# Patient Record
Sex: Female | Born: 1940 | Hispanic: Yes | Marital: Married | Smoking: Former smoker
Health system: Southern US, Community
[De-identification: ages and names within clinical notes are randomized; demographics above are authoritative.]

## PROBLEM LIST (undated history)

## (undated) DIAGNOSIS — Z86010 Personal history of colonic polyps: Secondary | ICD-10-CM

## (undated) DIAGNOSIS — I10 Essential (primary) hypertension: Secondary | ICD-10-CM

## (undated) DIAGNOSIS — M7580 Other shoulder lesions, unspecified shoulder: Secondary | ICD-10-CM

## (undated) HISTORY — DX: Other shoulder lesions, unspecified shoulder: M75.80

## (undated) HISTORY — PX: APPENDECTOMY: SHX54

## (undated) HISTORY — PX: CHOLECYSTECTOMY: SHX55

## (undated) HISTORY — DX: Essential (primary) hypertension: I10

## (undated) HISTORY — PX: CATARACT EXTRACTION W/ INTRAOCULAR LENS  IMPLANT, BILATERAL: SHX1307

---

## 2006-01-25 ENCOUNTER — Other Ambulatory Visit: Admission: RE | Admit: 2006-01-25 | Discharge: 2006-01-25 | Payer: Self-pay | Admitting: Gynecology

## 2006-05-15 ENCOUNTER — Encounter: Admission: RE | Admit: 2006-05-15 | Discharge: 2006-05-15 | Payer: Self-pay | Admitting: Gynecology

## 2007-01-29 ENCOUNTER — Other Ambulatory Visit: Admission: RE | Admit: 2007-01-29 | Discharge: 2007-01-29 | Payer: Self-pay | Admitting: Gynecology

## 2007-04-22 ENCOUNTER — Ambulatory Visit: Payer: Self-pay | Admitting: Gastroenterology

## 2007-04-22 ENCOUNTER — Ambulatory Visit (HOSPITAL_COMMUNITY): Admission: RE | Admit: 2007-04-22 | Discharge: 2007-04-22 | Payer: Self-pay | Admitting: Gastroenterology

## 2007-05-17 ENCOUNTER — Encounter: Admission: RE | Admit: 2007-05-17 | Discharge: 2007-05-17 | Payer: Self-pay | Admitting: Gynecology

## 2008-01-30 ENCOUNTER — Other Ambulatory Visit: Admission: RE | Admit: 2008-01-30 | Discharge: 2008-01-30 | Payer: Self-pay | Admitting: Gynecology

## 2008-02-13 ENCOUNTER — Ambulatory Visit (HOSPITAL_COMMUNITY): Admission: RE | Admit: 2008-02-13 | Discharge: 2008-02-13 | Payer: Self-pay | Admitting: Family Medicine

## 2008-05-22 ENCOUNTER — Encounter: Admission: RE | Admit: 2008-05-22 | Discharge: 2008-05-22 | Payer: Self-pay | Admitting: Gynecology

## 2008-07-31 ENCOUNTER — Ambulatory Visit: Payer: Self-pay | Admitting: Gynecology

## 2008-07-31 ENCOUNTER — Other Ambulatory Visit: Admission: RE | Admit: 2008-07-31 | Discharge: 2008-07-31 | Payer: Self-pay | Admitting: Gynecology

## 2008-07-31 ENCOUNTER — Encounter: Payer: Self-pay | Admitting: Gynecology

## 2009-04-29 ENCOUNTER — Encounter: Payer: Self-pay | Admitting: Gynecology

## 2009-04-29 ENCOUNTER — Ambulatory Visit: Payer: Self-pay | Admitting: Gynecology

## 2009-04-29 ENCOUNTER — Other Ambulatory Visit: Admission: RE | Admit: 2009-04-29 | Discharge: 2009-04-29 | Payer: Self-pay | Admitting: Gynecology

## 2009-05-24 ENCOUNTER — Encounter: Admission: RE | Admit: 2009-05-24 | Discharge: 2009-05-24 | Payer: Self-pay | Admitting: Gynecology

## 2010-02-15 ENCOUNTER — Ambulatory Visit (HOSPITAL_COMMUNITY): Admission: RE | Admit: 2010-02-15 | Discharge: 2010-02-15 | Payer: Self-pay | Admitting: Family Medicine

## 2010-05-02 ENCOUNTER — Other Ambulatory Visit: Admission: RE | Admit: 2010-05-02 | Discharge: 2010-05-02 | Payer: Self-pay | Admitting: Gynecology

## 2010-05-02 ENCOUNTER — Ambulatory Visit: Payer: Self-pay | Admitting: Gynecology

## 2010-05-30 ENCOUNTER — Encounter: Admission: RE | Admit: 2010-05-30 | Discharge: 2010-05-30 | Payer: Self-pay | Admitting: Gynecology

## 2010-06-21 ENCOUNTER — Ambulatory Visit: Payer: Self-pay | Admitting: Gynecology

## 2010-07-24 ENCOUNTER — Encounter: Payer: Self-pay | Admitting: Gynecology

## 2010-11-15 NOTE — Op Note (Signed)
NAMECITLALLI, Kaitlin Ayala NO.:  [id number]   MEDICAL RECORD NO.:  [id number]          PATIENT TYPE:  AMB   LOCATION:  DAY                           FACILITY:  [facility]   PHYSICIAN:  Kassie Mends, M.D.      DATE OF BIRTH:  [birth year]   DATE OF PROCEDURE:  04/22/2007  DATE OF DISCHARGE:                               OPERATIVE REPORT   REFERRING PHYSICIAN:  Assunta Found, M.D.   PROCEDURE:  Colonoscopy.   INDICATION FOR EXAM:  Kaitlin Ayala is a 70 year old female who presents  for average risk colon cancer screening.  She initialy denied any  history of colon polyps but did have uterine polyps. When asked  againprior to discharge she said she colon polyps removed one year ago  in [place].   FINDINGS:  1. Normal colon without evidence of polyps, masses, inflammatory      changes, diverticula or AVMs.  2. Moderate internal hemorrhoids.  Otherwise, normal retroflexed view      of the rectum.   RECOMMENDATIONS:  1. Screening colonoscopy in 5 years due to uncertainty about prior      history of colon polyps..  2. She should follow a high fiber diet.  She is given a handout on      high fiber diet and hemorrhoids.   MEDICATIONS:  1. Demerol 75 mg IV.  2. Versed 6 mg IV.   PROCEDURE TECHNIQUE:  Physical exam was performed and informed consent  was obtained from the patient after explaining the benefits, risks and  alternatives to the procedure.  The patient was connected to the monitor  and placed in the left lateral position.  Continuous oxygen was provided  by nasal cannula and IV medicine administered through an indwelling  cannula.  After administration of sedation and rectal exam, the  patient's rectum was intubated,  scope was advanced under direct visualization to the cecum.  The scope  was removed slowly by carefully examining the color, texture, anatomy  and integrity of the mucosa on the way out. The patient was recovered in  Endoscopy and discharged home in  satisfactory condition.      Kassie Mends, M.D.  Electronically Signed     SM/MEDQ  D:  04/22/2007  T:  04/22/2007  Job:  [id number]   cc:   Corrie Mckusick, M.D.  Fax: [phone]

## 2011-05-10 ENCOUNTER — Other Ambulatory Visit: Payer: Self-pay | Admitting: Family Medicine

## 2011-05-10 DIAGNOSIS — Z1231 Encounter for screening mammogram for malignant neoplasm of breast: Secondary | ICD-10-CM

## 2011-05-11 ENCOUNTER — Encounter: Payer: Self-pay | Admitting: Anesthesiology

## 2011-05-18 ENCOUNTER — Other Ambulatory Visit (HOSPITAL_COMMUNITY)
Admission: RE | Admit: 2011-05-18 | Discharge: 2011-05-18 | Disposition: A | Discharge status: Home / Self Care | Payer: Medicare Other | Source: Ambulatory Visit | Attending: Gynecology | Admitting: Gynecology

## 2011-05-18 ENCOUNTER — Encounter: Payer: Self-pay | Admitting: Gynecology

## 2011-05-18 ENCOUNTER — Ambulatory Visit (INDEPENDENT_AMBULATORY_CARE_PROVIDER_SITE_OTHER): Payer: Medicare Other | Admitting: Gynecology

## 2011-05-18 VITALS — BP 130/70 | Ht 58.5 in | Wt 202.0 lb

## 2011-05-18 DIAGNOSIS — Z124 Encounter for screening for malignant neoplasm of cervix: Secondary | ICD-10-CM

## 2011-05-18 DIAGNOSIS — Z780 Asymptomatic menopausal state: Secondary | ICD-10-CM

## 2011-05-18 DIAGNOSIS — Z1211 Encounter for screening for malignant neoplasm of colon: Secondary | ICD-10-CM

## 2011-05-18 DIAGNOSIS — Z01419 Encounter for gynecological examination (general) (routine) without abnormal findings: Secondary | ICD-10-CM

## 2011-05-18 DIAGNOSIS — N952 Postmenopausal atrophic vaginitis: Secondary | ICD-10-CM

## 2011-05-18 NOTE — Progress Notes (Signed)
Kaitlin Ayala 1941/02/19 [id number]   History:    70 y.o.  For gynecological exam. Patient frequently does her self breast examination. She had normal bone density study last year. She's eating better and tried excisable abortions loss less than pounds is last year. Dr. Phillips Odor is her primary physician and lateral schedule be drawn next muscle no lap will be drawn today. Patient had a colonoscopy 2005 were by benign polyps were noted followup colonoscopy 2008 was negative and she was informed she's 91-5 years from now.  Past medical history,surgical history, family history and social history were all reviewed and documented in the EPIC chart.  Gynecologic History Patient's last menstrual period was 05/18/1991. Contraception: none Last Pap: 2011. Results were:normal} Last mammogram: 2011. Results were: normal  Obstetric History OB History    Grav Para Term Preterm Abortions TAB SAB Ect Mult Living   3 2 2  1  1   2      # Outc Date GA Lbr Len/2nd Wgt Sex Del Anes PTL Lv   1 TRM     F SVD  No Yes   2 TRM     M SVD  No Yes   3 SAB                ROS:  Was performed and pertinent positives and negatives are included in the history.  Exam: chaperone present  BP 130/70  Ht 4' 10.5" (1.486 m)  Wt 202 lb (91.627 kg)  BMI 41.50 kg/m2  LMP 05/18/1991  Body mass index is 41.50 kg/(m^2).  General appearance : Well developed well nourished female. No acute distress HEENT: Neck supple, trachea midline, no carotid bruits, no thyroidmegaly Lungs: Clear to auscultation, no rhonchi or wheezes, or rib retractions  Heart: Regular rate and rhythm, no murmurs or gallops Breast:Examined in sitting and supine position were symmetrical in appearance, no palpable masses or tenderness,  no skin retraction, no nipple inversion, no nipple discharge, no skin discoloration, no axillary or supraclavicular lymphadenopathy Abdomen: no palpable masses or tenderness, no rebound or guarding Extremities: no  edema or skin discoloration or tenderness  Pelvic:  Bartholin, Urethra, Skene Glands: Within normal limits             Vagina: No gross lesions or discharge  Cervix: No gross lesions or discharge  Uterus  axial, normal size, shape and consistency, non-tender and mobile  Adnexa  Without masses or tenderness  Anus and perineum  normal   Rectovaginal  normal sphincter tone without palpated masses or tenderness             Hemoccult deferred     Assessment/Plan:  70 y.o. female gynecological exam. Patient with some vaginal atrophy but although is not symptomatic. Patient on no hormone replacement therapy. She will submit to the office the fecal occult blood testing kit to analyze since her cervix was slightly friable tone the Pap smear did not want to cause any contamination get a false positive fecal occult blood testing. Pap smear was done today. She was encouraged to continue her calcium and vitamin D for osteoporosis prevention and regular exercise. We'll do a bone density study next year. She will follow for mammogram next month and labs drawn by Dr. Lamar Blinks office. We discussed the new guidelines from the American Society of for colposcopy and cervical pathology as well the American site for clinical pathology that women older than 70 years of age no screen as necessary since she's had adequate negative prior  screen results.   Ok Edwards MD, 10:52 AM 05/18/2011

## 2011-05-18 NOTE — Progress Notes (Signed)
Addended by: Landis Martins R on: 05/18/2011 04:36 PM   Modules accepted: Orders

## 2011-06-05 ENCOUNTER — Other Ambulatory Visit (INDEPENDENT_AMBULATORY_CARE_PROVIDER_SITE_OTHER): Payer: Medicare Other | Admitting: *Deleted

## 2011-06-05 DIAGNOSIS — Z1211 Encounter for screening for malignant neoplasm of colon: Secondary | ICD-10-CM

## 2011-06-15 ENCOUNTER — Ambulatory Visit
Admission: RE | Admit: 2011-06-15 | Discharge: 2011-06-15 | Disposition: A | Discharge status: Home / Self Care | Payer: Medicare Other | Source: Ambulatory Visit | Attending: Family Medicine | Admitting: Family Medicine

## 2011-06-15 DIAGNOSIS — Z1231 Encounter for screening mammogram for malignant neoplasm of breast: Secondary | ICD-10-CM

## 2011-06-19 IMAGING — CR DG ANKLE COMPLETE 3+V*R*
2 series · 2 of 2 positions shown · non-contrast
Comparison: None

CLINICAL DATA: Ankle swelling, no known injury

RIGHT ANKLE - COMPLETE 3+ VIEW

[view not recorded (1 of 2)]
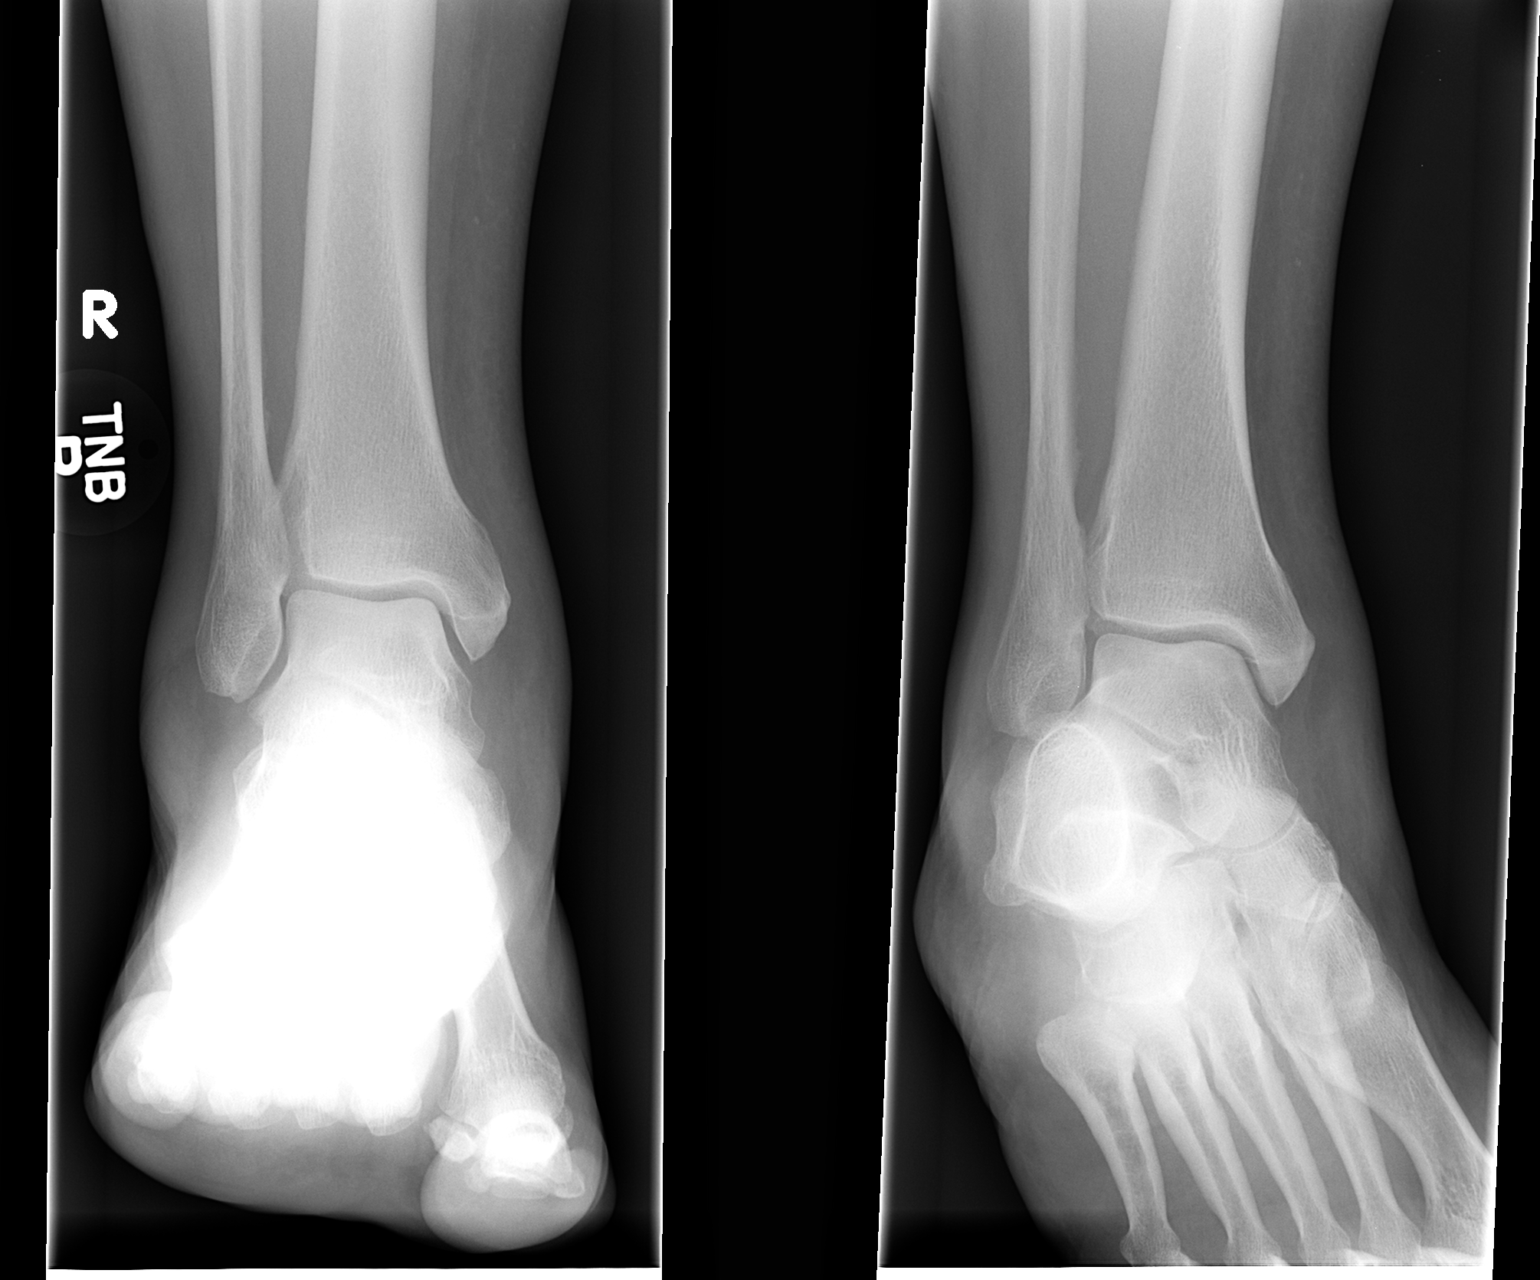

[view not recorded (2 of 2)]
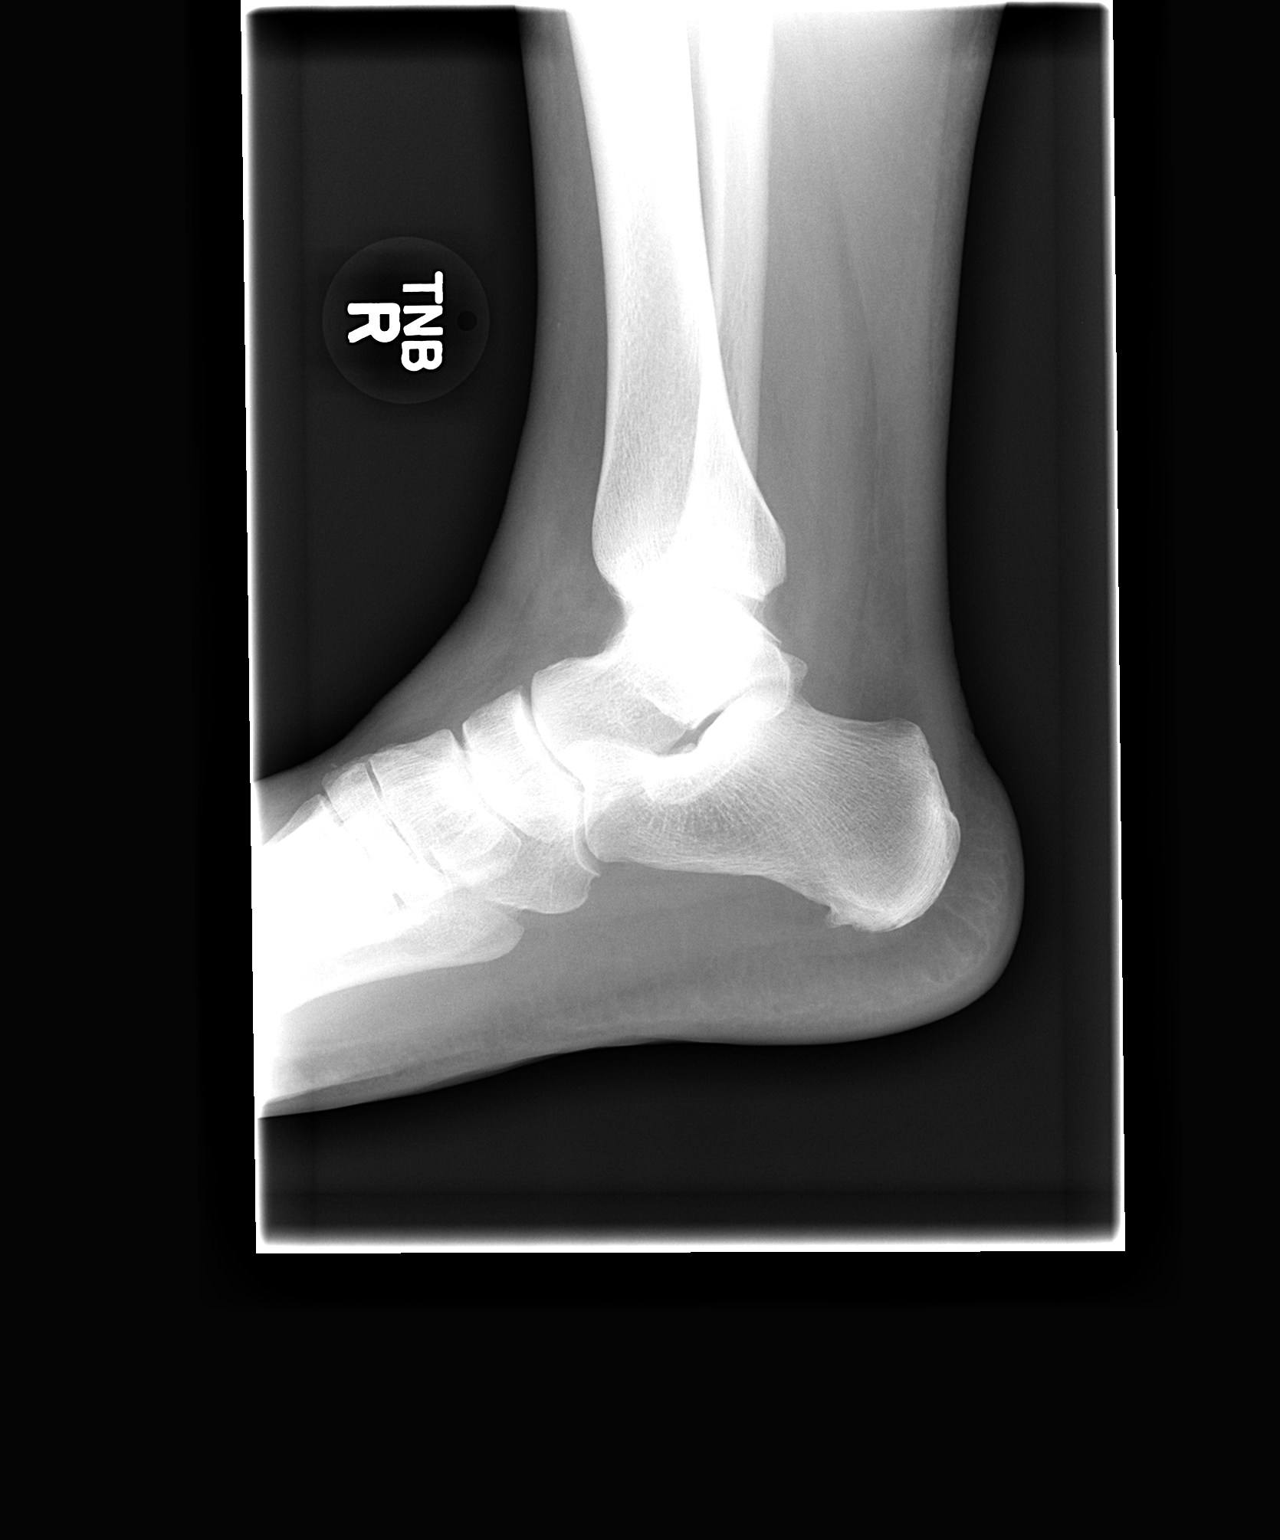

[2 of 2 positions shown; findings below may reference images not displayed]

FINDINGS: Diffuse soft tissue swelling right lower leg and ankle.
Ankle mortise intact.
Bone mineralization normal.
Tiny plantar calcaneal spur.
No acute fracture, dislocation or bone destruction.
IMPRESSION: Diffuse soft tissue swelling.
No acute bony abnormalities.

## 2011-12-14 ENCOUNTER — Telehealth: Payer: Self-pay | Admitting: Gastroenterology

## 2011-12-14 NOTE — Telephone Encounter (Signed)
Called the [phone] number listed and it is not in service.

## 2011-12-14 NOTE — Telephone Encounter (Signed)
Reminder in epic for patient to have tcs in Oct 2013

## 2011-12-14 NOTE — Telephone Encounter (Signed)
Called and informed Katie at [place]. Told her we have pt on a recall list for Oct. 2013. She also said that the phone number of [phone] had an area code of 305. I tried that, and it worked. Called and informed pt.

## 2011-12-14 NOTE — Telephone Encounter (Signed)
Tried to call pt. The number given of [phone] has been disconnected.

## 2011-12-14 NOTE — Telephone Encounter (Signed)
Routing to Darl Pikes to nic for 04/2012.

## 2011-12-14 NOTE — Telephone Encounter (Signed)
Kaitlin Ayala, try calling [phone]. That is the number PCP office gave me.

## 2011-12-14 NOTE — Telephone Encounter (Signed)
PLEASE CALL PT  AND LET HER KNOW.

## 2011-12-14 NOTE — Telephone Encounter (Signed)
PT HAD TCS OCT 2008 & A PERSONAL HX OF COLON POLYPS AND NEEDS TCS OCT 2013.

## 2011-12-14 NOTE — Telephone Encounter (Signed)
Katie from [facility] called to say that Dr Phillips Odor wants patient to have tcs in August. I have patient on recall list for 2018. I told her that I would get with triage nurse/SF to see what they advise and for PCP to fax [place] office notes/labs. Pt can be reached at [phone]

## 2012-03-08 ENCOUNTER — Encounter: Payer: Self-pay | Admitting: Gastroenterology

## 2012-03-11 ENCOUNTER — Encounter: Payer: Self-pay | Admitting: Gastroenterology

## 2012-03-11 ENCOUNTER — Ambulatory Visit (INDEPENDENT_AMBULATORY_CARE_PROVIDER_SITE_OTHER): Payer: Medicare Other | Admitting: Gastroenterology

## 2012-03-11 VITALS — BP 137/76 | HR 68 | Temp 97.4°F | Ht 65.0 in | Wt 208.6 lb

## 2012-03-11 DIAGNOSIS — Z86010 Personal history of colonic polyps: Secondary | ICD-10-CM

## 2012-03-11 NOTE — Patient Instructions (Addendum)
You are scheduled for a colonoscopy with Dr. Darrick Penna in the near future.  I will speak with her about the type of prep to take before the procedure. We will call you soon to let you know what she has decided.

## 2012-03-11 NOTE — Assessment & Plan Note (Signed)
Due for surveillance, last TCS 2008 without polyps. Remote hx of polyps in [place], path unknown. Due to uncertainty, she is on the 5-year-surveillance plan. No lower GI symptoms currently. Her only concern is drinking the prep. States she "took pills" last time. Notes nausea with large amounts of fluid. Will discuss with Dr. Darrick Penna possible prep we may use for her.   Proceed with colonoscopy with Dr. Darrick Penna in the near future. The risks, benefits, and alternatives have been discussed in detail with the patient. They state understanding and desire to proceed.

## 2012-03-11 NOTE — Progress Notes (Signed)
Primary Care Physician:  Colette Ribas, MD Primary Gastroenterologist:  Dr. Darrick Penna   Chief Complaint  Patient presents with  . Colonoscopy    HPI:   Kaitlin Ayala is a 71 year old female who presents today for a visit prior to surveillance colonoscopy. She speaks English fairly well but still has an interpreter present today. She has a remote hx of colon polyps with unknown path. Last TCS performed by [place] in 2008 with internal hemorrhoids, no polyps.  No abdominal pain. No N/V. No rectal bleeding. No wt loss or lack of appetite. Rare reflux. No constipation, diarrhea. She is concerned about the prep, stating she would like "the pills" and becomes quite nauseated with large amounts of fluid.   Past Medical History  Diagnosis Date  . Hypertension   . Left knee pain     Past Surgical History  Procedure Date  . Appendectomy   . Cholecystectomy   . Cataract extraction, bilateral   . Colonoscopy w/ polypectomy 2005    BENIGN POLYP?  Marland Kitchen Colonoscopy 04/22/2007    Moderate internal hemorrhoids.  Otherwise, normal     Current Outpatient Prescriptions  Medication Sig Dispense Refill  . calcium carbonate (OS-CAL) 600 MG TABS Take 600 mg by mouth 2 (two) times daily with a meal.        . cholecalciferol (VITAMIN D) 1000 UNITS tablet Take 1,000 Units by mouth daily.        . nadolol (CORGARD) 40 MG tablet Take 40 mg by mouth daily.        . Chromium Picolinate 200 MCG TABS Take by mouth.        . fish oil-omega-3 fatty acids 1000 MG capsule Take 2 g by mouth daily.        Marland Kitchen glucosamine-chondroitin 500-400 MG tablet Take 1 tablet by mouth 3 (three) times daily.          Allergies as of 03/11/2012 - Review Complete 03/11/2012  Allergen Reaction Noted  . Codeine Hives 05/11/2011  . Iodine Other (See Comments) 05/11/2011    Family History  Problem Relation Age of Onset  . Cancer Mother     UTERINE  . Heart disease Father   . Diabetes Daughter   . Hypertension Sister   . Colon  cancer Neg Hx     History   Social History  . Marital Status: Married    Spouse Name: N/A    Number of Children: 2  . Years of Education: N/A   Occupational History  .     Social History Main Topics  . Smoking status: Former Smoker    Quit date: 05/17/2009  . Smokeless tobacco: Never Used  . Alcohol Use: Yes  . Drug Use: No  . Sexually Active: Yes    Birth Control/ Protection: Post-menopausal   Other Topics Concern  . Not on file   Social History Narrative   2 grandchildren, 1 great grandchildren.    Review of Systems: Gen: Denies any fever, chills, fatigue, weight loss, lack of appetite.  CV: Denies chest pain, heart palpitations, peripheral edema, syncope.  Resp: Denies shortness of breath at rest or with exertion. Denies wheezing or cough.  GI: Denies dysphagia or odynophagia. Denies jaundice, hematemesis, fecal incontinence. GU : Denies urinary burning, urinary frequency, urinary hesitancy MS: Denies joint pain, muscle weakness, cramps, or limitation of movement.  Derm: Denies rash, itching, dry skin Psych: Denies depression, anxiety, memory loss, and confusion Heme: Denies bruising, bleeding, and enlarged lymph nodes.  Physical Exam:  BP 137/76  Pulse 68  Temp 97.4 F (36.3 C) (Temporal)  Ht 5\' 5"  (1.651 m)  Wt 208 lb 9.6 oz (94.62 kg)  BMI 34.71 kg/m2  LMP 05/18/1991 General:   Alert and oriented. Pleasant and cooperative. Well-nourished and well-developed.  Head:  Normocephalic and atraumatic. Eyes:  Without icterus, sclera clear and conjunctiva pink.  Ears:  Normal auditory acuity. Nose:  No deformity, discharge,  or lesions. Mouth:  No deformity or lesions, oral mucosa pink.  Neck:  Supple, without mass or thyromegaly. Lungs:  Clear to auscultation bilaterally. No wheezes, rales, or rhonchi. No distress.  Heart:  S1, S2 present without murmurs appreciated.  Abdomen:  +BS, soft, non-tender and non-distended. No HSM noted. No guarding or rebound. No  masses appreciated.  Rectal:  Deferred  Msk:  Symmetrical without gross deformities. Normal posture. Extremities:  Without clubbing or edema. Neurologic:  Alert and  oriented x4;  grossly normal neurologically. Skin:  Intact without significant lesions or rashes. Cervical Nodes:  No significant cervical adenopathy. Psych:  Alert and cooperative. Normal mood and affect.

## 2012-03-11 NOTE — Progress Notes (Signed)
Faxed to PCP

## 2012-03-18 ENCOUNTER — Encounter: Payer: Self-pay | Admitting: Gastroenterology

## 2012-03-18 NOTE — Progress Notes (Unsigned)
Per [facility]: May use Osmoprep for TCS.

## 2012-03-19 ENCOUNTER — Other Ambulatory Visit: Payer: Self-pay | Admitting: Gastroenterology

## 2012-03-19 MED ORDER — SOD PHOS MONO-SOD PHOS DIBASIC 1.102-0.398 G PO TABS: 32.0000 | ORAL_TABLET | ORAL | Status: DC

## 2012-03-19 NOTE — Progress Notes (Signed)
[employer] have been mailed to the patient

## 2012-04-02 ENCOUNTER — Other Ambulatory Visit: Payer: Self-pay | Admitting: Family Medicine

## 2012-04-02 DIAGNOSIS — Z1231 Encounter for screening mammogram for malignant neoplasm of breast: Secondary | ICD-10-CM

## 2012-04-05 ENCOUNTER — Encounter (HOSPITAL_COMMUNITY)
Admission: RE | Disposition: A | Discharge status: Home / Self Care | Payer: Self-pay | Source: Ambulatory Visit | Attending: Gastroenterology

## 2012-04-05 ENCOUNTER — Ambulatory Visit (HOSPITAL_COMMUNITY)
Admission: RE | Admit: 2012-04-05 | Discharge: 2012-04-05 | Disposition: A | Discharge status: Home / Self Care | Payer: Medicare Other | Source: Ambulatory Visit | Attending: Gastroenterology | Admitting: Gastroenterology

## 2012-04-05 ENCOUNTER — Encounter (HOSPITAL_COMMUNITY): Payer: Self-pay | Admitting: *Deleted

## 2012-04-05 DIAGNOSIS — D128 Benign neoplasm of rectum: Secondary | ICD-10-CM | POA: Insufficient documentation

## 2012-04-05 DIAGNOSIS — Z860100 Personal history of colon polyps, unspecified: Secondary | ICD-10-CM | POA: Insufficient documentation

## 2012-04-05 DIAGNOSIS — D126 Benign neoplasm of colon, unspecified: Secondary | ICD-10-CM

## 2012-04-05 DIAGNOSIS — I10 Essential (primary) hypertension: Secondary | ICD-10-CM | POA: Insufficient documentation

## 2012-04-05 DIAGNOSIS — K620 Anal polyp: Secondary | ICD-10-CM

## 2012-04-05 DIAGNOSIS — K621 Rectal polyp: Secondary | ICD-10-CM

## 2012-04-05 DIAGNOSIS — Z1211 Encounter for screening for malignant neoplasm of colon: Secondary | ICD-10-CM

## 2012-04-05 DIAGNOSIS — K648 Other hemorrhoids: Secondary | ICD-10-CM | POA: Insufficient documentation

## 2012-04-05 HISTORY — PX: COLONOSCOPY: SHX5424

## 2012-04-05 SURGERY — COLONOSCOPY
Anesthesia: Moderate Sedation

## 2012-04-05 MED ORDER — MEPERIDINE HCL 100 MG/ML IJ SOLN
INTRAMUSCULAR | Status: AC
  Filled 2012-04-05: qty 1

## 2012-04-05 MED ORDER — MIDAZOLAM HCL 5 MG/5ML IJ SOLN
INTRAMUSCULAR | Status: DC | PRN
  Administered 2012-04-05 (×2): 2 mg via INTRAVENOUS

## 2012-04-05 MED ORDER — MEPERIDINE HCL 100 MG/ML IJ SOLN
INTRAMUSCULAR | Status: DC | PRN
Start: 2012-04-05 — End: 2012-04-05
  Administered 2012-04-05 (×2): 25 mg via INTRAVENOUS

## 2012-04-05 MED ORDER — STERILE WATER FOR IRRIGATION IR SOLN
Status: DC | PRN
  Administered 2012-04-05: 09:00:00

## 2012-04-05 MED ORDER — SODIUM CHLORIDE 0.45 % IV SOLN
INTRAVENOUS | Status: DC
  Administered 2012-04-05: 20 mL/h via INTRAVENOUS

## 2012-04-05 MED ORDER — MIDAZOLAM HCL 5 MG/5ML IJ SOLN
INTRAMUSCULAR | Status: AC
  Filled 2012-04-05: qty 10

## 2012-04-05 NOTE — Op Note (Signed)
[facility] [address] [place], [address]   COLONOSCOPY PROCEDURE REPORT  PATIENT: Kaitlin Ayala, Kaitlin Ayala  MR#: [id number] BIRTHDATE: 11/24/1940 , 70  yrs. old GENDER: Female ENDOSCOPIST: Jonette Eva, MD REFERRED XB:JYNW Phillips Odor, M.D. PROCEDURE DATE:  04/05/2012 PROCEDURE:   Colonoscopy with biopsy INDICATIONS:patient's personal history of colon polyps. MEDICATIONS: Demerol 50 mg IV and Versed 4 mg IV  DESCRIPTION OF PROCEDURE:    Physical exam was performed.  Informed consent was obtained from the patient after explaining the benefits, risks, and alternatives to procedure.  The patient was connected to monitor and placed in left lateral position. Continuous oxygen was provided by nasal cannula and IV medicine administered through an indwelling cannula.  After administration of sedation and rectal exam, the patients rectum was intubated and the EC-[id number] ([id number])  colonoscope was advanced under direct visualization to the cecum.  The scope was removed slowly by carefully examining the color, texture, anatomy, and integrity mucosa on the way out.  The patient was recovered in endoscopy and discharged home in satisfactory condition.       COLON FINDINGS: Six sessile polyps ranging between 3-75mm in size were found in the descending colon, sigmoid colon, and rectum.  A polypectomy was performed with cold forceps.  , The colon was otherwise normal.  There was no diverticulosis, inflammation, polyps or cancers unless previously stated.  , and Small internal hemorrhoids were found.  PREP QUALITY: excellent. CECAL W/D TIME: 14 minutes  COMPLICATIONS: None  ENDOSCOPIC IMPRESSION: 1.   Six sessile polyps ranging between 3-73mm in size were found in the descending colon, sigmoid colon, and rectum; polypectomy was performed with cold forceps 2.   The colon was otherwise normal 3.   Small internal hemorrhoids   RECOMMENDATIONS: FOLLOW A HIGH FIBER  DIET.  BIOPSY RESULTS SHOULD BE BACK IN 7 DAYS.  Next colonoscopy in 5-10 years.       _______________________________ Rosalie DoctorJonette Eva, MD 04/05/2012 3:22 PM     PATIENT NAME:  Kaitlin Ayala MR#: [id number]

## 2012-04-05 NOTE — H&P (Signed)
  Primary Care Physician:  Colette Ribas, MD Primary Gastroenterologist:  Dr. Darrick Penna  Pre-Procedure History & Physical: HPI:  Kaitlin Ayala is a 71 y.o. female here for  PERSONAL HISTORY OF POLYPS.   Past Medical History  Diagnosis Date  . Hypertension   . Left knee pain     Past Surgical History  Procedure Date  . Appendectomy   . Cholecystectomy   . Cataract extraction, bilateral   . Colonoscopy w/ polypectomy 2005    BENIGN POLYP?  Marland Kitchen Colonoscopy 04/22/2007    Moderate internal hemorrhoids.  Otherwise, normal     Prior to Admission medications   Medication Sig Start Date End Date Taking? Authorizing Provider  calcium carbonate (OS-CAL) 600 MG TABS Take 600 mg by mouth 2 (two) times daily with a meal.     Yes Historical Provider, MD  cholecalciferol (VITAMIN D) 1000 UNITS tablet Take 1,000 Units by mouth daily.     Yes Historical Provider, MD  Chromium Picolinate 200 MCG TABS Take by mouth.     Yes Historical Provider, MD  fish oil-omega-3 fatty acids 1000 MG capsule Take 2 g by mouth daily.     Yes Historical Provider, MD  glucosamine-chondroitin 500-400 MG tablet Take 1 tablet by mouth 3 (three) times daily.     Yes Historical Provider, MD  nadolol (CORGARD) 40 MG tablet Take 40 mg by mouth daily.     Yes Historical Provider, MD  sodium phosphates (OSMOPREP) 1.102-0.398 G TABS Take 32 tablets by mouth as directed. 03/19/12  Yes [place] B[place], MD    Allergies as of 03/11/2012 - Review Complete 03/11/2012  Allergen Reaction Noted  . Codeine Hives 05/11/2011  . Iodine Other (See Comments) 05/11/2011    Family History  Problem Relation Age of Onset  . Cancer Mother     UTERINE  . Heart disease Father   . Diabetes Daughter   . Hypertension Sister   . Colon cancer Neg Hx     History   Social History  . Marital Status: Married    Spouse Name: N/A    Number of Children: 2  . Years of Education: N/A   Occupational History  .     Social History Main  Topics  . Smoking status: Former Smoker    Quit date: 05/17/2009  . Smokeless tobacco: Never Used  . Alcohol Use: Yes  . Drug Use: No  . Sexually Active: Yes    Birth Control/ Protection: Post-menopausal   Other Topics Concern  . Not on file   Social History Narrative   2 grandchildren, 1 great grandchildren.    Review of Systems: See HPI, otherwise negative ROS   Physical Exam: BP 135/62  Pulse 70  Temp 97.7 F (36.5 C) (Oral)  Resp 19  SpO2 98%  LMP 05/18/1991 General:   Alert,  pleasant and cooperative in NAD Head:  Normocephalic and atraumatic. Neck:  Supple; Lungs:  Clear throughout to auscultation.    Heart:  Regular rate and rhythm. Abdomen:  Soft, nontender and nondistended. Normal bowel sounds, without guarding, and without rebound.   Neurologic:  Alert and  oriented x4;  grossly normal neurologically.  Impression/Plan:     PERSONAL HISTORY OF POLYPS.  PLAN: 1. TCS TODAY

## 2012-04-10 ENCOUNTER — Encounter (HOSPITAL_COMMUNITY): Payer: Self-pay | Admitting: Gastroenterology

## 2012-04-10 ENCOUNTER — Telehealth: Payer: Self-pay | Admitting: Gastroenterology

## 2012-04-10 NOTE — Telephone Encounter (Signed)
Faxed to PCP, recall made  

## 2012-04-10 NOTE — Telephone Encounter (Signed)
Please call pt. She had HYPERPLASTIC POLYPS removed from her colon. High fiber diet. TCS in 5 years.

## 2012-04-10 NOTE — Telephone Encounter (Signed)
Called and informed pt.  

## 2012-04-18 NOTE — Progress Notes (Signed)
TCS OCT 2013 HYPERPLASTIC POLYPS  REVIEWED.

## 2012-05-22 ENCOUNTER — Encounter: Payer: Self-pay | Admitting: Gynecology

## 2012-05-22 ENCOUNTER — Ambulatory Visit (INDEPENDENT_AMBULATORY_CARE_PROVIDER_SITE_OTHER): Payer: Medicare Other | Admitting: Gynecology

## 2012-05-22 VITALS — BP 134/88 | Ht 58.5 in | Wt 204.0 lb

## 2012-05-22 DIAGNOSIS — I10 Essential (primary) hypertension: Secondary | ICD-10-CM

## 2012-05-22 DIAGNOSIS — R635 Abnormal weight gain: Secondary | ICD-10-CM

## 2012-05-22 DIAGNOSIS — E669 Obesity, unspecified: Secondary | ICD-10-CM

## 2012-05-22 DIAGNOSIS — K219 Gastro-esophageal reflux disease without esophagitis: Secondary | ICD-10-CM

## 2012-05-22 DIAGNOSIS — Z780 Asymptomatic menopausal state: Secondary | ICD-10-CM

## 2012-05-22 NOTE — Progress Notes (Signed)
Kaitlin Ayala 1941-05-01 [id number]   History:    71 y.o.  who was seen last year for gynecological examination. Patient had a colonoscopy this year and had several polyps removed which were benign. She's had several polyps removed in the past as well. Her last bone density study was in 2011 which was normal. Dr. Phillips Odor is her primary physician has been treated for hypertension and she scheduled to see them next week for blood work. Patient with no past history of abnormal Pap smears. She gained 2 pounds from last year. Her BMI is 41.91. She is taking her calcium and vitamin D. Her last mammogram was normal in 2012. Patient does her monthly self breast examination. Patient declined flu vaccine.  Past medical history,surgical history, family history and social history were all reviewed and documented in the EPIC chart.  Gynecologic History Patient's last menstrual period was 05/18/1991. Contraception: post menopausal status Last Pap: 2012. Results were: normal Last mammogram: 2012. Results were: normal  Obstetric History OB History    Grav Para Term Preterm Abortions TAB SAB Ect Mult Living   3 2 2  1  1   2      # Outc Date GA Lbr Len/2nd Wgt Sex Del Anes PTL Lv   1 TRM     F SVD  No Yes   2 TRM     M SVD  No Yes   3 SAB                ROS: A ROS was performed and pertinent positives and negatives are included in the history.  GENERAL: No fevers or chills. HEENT: No change in vision, no earache, sore throat or sinus congestion. NECK: No pain or stiffness. CARDIOVASCULAR: No chest pain or pressure. No palpitations. PULMONARY: No shortness of breath, cough or wheeze. GASTROINTESTINAL: No abdominal pain, nausea, vomiting or diarrhea, melena or bright red blood per rectum. GENITOURINARY: No urinary frequency, urgency, hesitancy or dysuria. MUSCULOSKELETAL: No joint or muscle pain, no back pain, no recent trauma. DERMATOLOGIC: No rash, no itching, no lesions. ENDOCRINE: No polyuria,  polydipsia, no heat or cold intolerance. No recent change in weight. HEMATOLOGICAL: No anemia or easy bruising or bleeding. NEUROLOGIC: No headache, seizures, numbness, tingling or weakness. PSYCHIATRIC: No depression, no loss of interest in normal activity or change in sleep pattern.     Exam: chaperone present  BP 134/88  Ht 4' 10.5" (1.486 m)  Wt 204 lb (92.534 kg)  BMI 41.91 kg/m2  LMP 05/18/1991  Body mass index is 41.91 kg/(m^2).  General appearance : Well developed well nourished female. No acute distress HEENT: Neck supple, trachea midline, no carotid bruits, no thyroidmegaly Lungs: Clear to auscultation, no rhonchi or wheezes, or rib retractions  Heart: Regular rate and rhythm, no murmurs or gallops Breast:Examined in sitting and supine position were symmetrical in appearance, no palpable masses or tenderness,  no skin retraction, no nipple inversion, no nipple discharge, no skin discoloration, no axillary or supraclavicular lymphadenopathy Abdomen: no palpable masses or tenderness, no rebound or guarding Extremities: no edema or skin discoloration or tenderness  Pelvic:  Bartholin, Urethra, Skene Glands: Within normal limits             Vagina: No gross lesions or discharge  Cervix: No gross lesions or discharge  Uterus  axial, normal size, shape and consistency, non-tender and mobile  Adnexa  Without masses or tenderness  Anus and perineum  normal   Rectovaginal  normal sphincter tone without  palpated masses or tenderness             Hemoccult colonoscopy this year     Assessment/Plan:  71 y.o. female for annual exam no abnormalities detected. Patient states she has no GU or GI complaints. Although after her recent endoscopy she stated she had some reflux but is gone better she took a H2 antagonists for a few days and resolved her symptoms. I provided her with literature formation on cholesterol lowering diet as well as on exercise. She was reminded take her calcium  vitamin D for osteoporosis prevention. She will schedule her bone density study for January of 2014. She was reminded also to follow for mammogram as well.    Ok Edwards MD, 10:43 AM 05/22/2012

## 2012-05-22 NOTE — Patient Instructions (Addendum)
Control del colesterol  Los niveles de colesterol en el organismo estn determinados significativamente por su dieta. Los niveles de colesterol tambin se relacionan con la enfermedad cardaca. El material que sigue ayuda a explicar esta relacin y a analizar qu puede hacer para mantener su corazn sano. No todo el colesterol es malo. Las lipoprotenas de baja densidad (LDL) forman el colesterol "malo". El colesterol malo puede ocasionar depsitos de grasa que se acumulan en el interior de las arterias. Las lipoprotenas de alta densidad (HDL) es el colesterol "bueno". Ayuda a remover el colesterol LDL "malo" de la sangre. El colesterol es un factor de riesgo muy importante para la enfermedad cardaca. Otros factores de riesgo son la hipertensin arterial, el hbito de fumar, el estrs, la herencia y el peso.   El msculo cardaco obtiene el suministro de sangre a travs de las arterias coronarias. Si su colesterol LDL ("malo") est elevado y el HDL ("bueno") es bajo, tiene un factor de riesgo para que se formen depsitos de grasa en las arterias coronarias (los vasos sanguneos que suministran sangre al corazn). Esto hace que haya menos lugar para que la sangre circule. Sin la suficiente sangre y oxgeno, el msculo cardaco no puede funcionar correctamente, y usted podr sentir dolores en el pecho (angina pectoris). Cuando una arteria coronaria se cierra completamente, una parte del msculo cardaco puede morir (infarto de miocardio).  CONTROL DEL COLESTEROL Cuando el profesional que lo asiste enva la sangre al laboratorio para conocer el nivel de colesterol, puede realizarle tambin un perfil completo de los lpidos. Con esta prueba, se puede determinar la cantidad total de colesterol, as como los niveles de LDL y HDL. Los triglicridos son un tipo de grasa que circula en la sangre y que tambin puede utilizarse para determinar el riesgo de enfermedad  cardaca. En la siguiente tabla se establecen los nmeros ideales: Prueba: Colesterol total  Menos de 200 mg/dl.  Prueba: LDL "colesterol malo"  Menos de 100 mg/dl.   Menos de 70 mg/dl si tiene riesgo muy elevado de sufrir un ataque cardaco o muerte cardaca sbita.  Prueba: HDL "colesterol bueno"  Mujeres: Ms de 50 mg/dl.   Hombres: Ms de 40 mg/dl.  Prueba: Trigliceridos  Menos de 150 mg/dl.    CONTROL DEL COLESTEROL CON DIETA Aunque factores como el ejercicio y el estilo de vida son importantes, la "primera lnea de ataque" es la dieta. Esto se debe a que se sabe que ciertos alimentos hacen subir el colesterol y otros lo bajan. El objetivo debe ser equilibrar los alimentos, de modo que tengan un efecto sobre el colesterol y, an ms importante, reemplazar las grasas saturadas y trans con otros tipos de grasas, como las monoinsaturadas y las poliinsaturadas y cidos grasos omega-3 . En promedio, una persona no debe consumir ms de 15 a 17 g de grasas saturadas por da. Las grasas saturadas y trans se consideran grasas "malas", ya que elevan el colesterol LDL. Las grasas saturadas se encuentran principalmente en productos animales como carne, manteca y crema. Pero esto no significa que usted debe sacrificar todas sus comidas favoritas. Actualmente, como lo muestra el cuadro que figura al final de este documento, hay sustitutos de buen sabor, bajos en grasas y en colesterol, para la mayora de los alimentos que a usted le gusta comer. Elija aquellos alimentos alternativos que sean bajos en grasas o sin grasas. Elija cortes de carne del cuarto trasero o lomo ya que estos cortes son los que tienen menor cantidad de grasa   y colesterol. El pollo (sin piel), el pescado, la carne de ternera, y la pechuga de pavo molida son excelentes opciones. Elimine las carnes grasosas como los hotdogs o el salami. Los mariscos tienen poco o nada de grasas saturadas. Cuando consuma carne magra, carne de aves de  corral, o pescado, hgalo en porciones de 85 gramos (3 onzas). Las grasas trans tambin se llaman "aceites parcialmente hidrogenados". Son aceites manipulados cientficamente de modo que son slidos a temperatura ambiente, tienen una larga vida y mejoran el sabor y la textura de los alimentos a los que se agregan. Las grasas trans se encuentran en la margarina, masitas, crackers y alimentos horneados.  Para hornear y cocinar, el aceite es un excelente sustituto para la mantequilla. Los aceites monoinsaturados tienen un beneficio particular, ya que se cree que disminuyen el colesterol LDL (colesterol malo) y elevan el HDL. Deber evitar los aceites tropicales saturados como el de coco y el de palma.  Recuerde, adems, que puede comer sin restricciones los grupos de alimentos que son naturalmente libres de grasas saturadas y grasas trans, entre los que se incluyen el pescado, las frutas (excepto el aguacate), verduras, frijoles, cereales (cebada, arroz, cuzcuz, trigo) y las pastas (sin salsas con crema)   IDENTIFIQUE LOS ALIMENTOS QUE DISMINUYEN EL COLESTEROL  Pueden disminuir el colesterol las fibras solubles que estn en las frutas, como las manzanas, en los vegetales como el brcoli, las patatas y las zanahorias; en las legumbres como frijoles, guisantes y lentejas; y en los cereales como la cebada. Los alimentos fortificados con fitosteroles tambin pueden disminuir el colesterol. Debe consumir al menos 2 g de estos alimentos a diario para obtener el efecto de disminucin de colesterol.  En el supermercado, lea las etiquetas de los envases para identificar los alimentos bajos en grasas saturadas, libres de grasas trans y bajos en grasas, . Elija quesos que tengan solo de 2 a 3 g de grasa saturada por onza (28,35 g). Use una margarina que no dae el corazn, libre de grasas trans o aceite parcialmente hidrogenado. Al comprar alimentos horneados (galletitas dulces y galletas) evite el aceite parcialmente  hidrogenado. Los panes y bollos debern ser de granos enteros (harina de maz o de avena entera, en lugar de "harina" o "harina enriquecida"). Compre sopas en lata que no sean cremosas, con bajo contenido de sal y sin grasas adicionadas.   TCNICAS DE PREPARACIN DE LOS ALIMENTOS  Nunca fra los alimentos en aceite abundante. Si debe frer, hgalo en poco aceite y removiendo constantemente, porque as se utilizan muy pocas grasas, o utilice un spray antiadherente. Cuando le sea posible, hierva, hornee o ase las carnes y cocine los vegetales al vapor. En vez de aderezar los vegetales con mantequilla o margarina, utilice limn y hierbas, pur de manzanas y canela (para las calabazas y batatas), yogurt y salsa descremados y aderezos para ensaladas bajos en contenido graso.   BAJO EN GRASAS SATURADAS / SUSTITUTOS BAJOS EN GRASA  Carnes / Grasas saturadas (g)  Evite: Bife, corte graso (3 oz/85 g) / 11 g   Elija: Bife, corte magro (3 oz/85 g) / 4 g   Evite: Hamburguesa (3 oz/85 g) / 7 g   Elija:  [place]amb[place]uesa magra (3 oz/85 g) / 5 g   Evite: Jamn (3 oz/85 g) / 6 g   Elija:  Jamn magro (3 oz/85 g) / 2.4 g   Evite: Pollo, con piel (3 oz/85 g), Carne oscura / 4 g   Elija:  Pollo, sin piel (  3 oz/85 g), Carne oscura / 2 g   Evite: Pollo, con piel (3 oz/85 g), Carne magra / 2.5 g   Elija: Pollo, sin piel (3 oz/85 g), Carne magra / 1 g  Lcteos / Grasas saturadas (g)  Evite: Leche entera (1 taza) / 5 g   Elija: Leche con bajo contenido de grasa, 2% (1 taza) / 3 g   Elija: Leche con bajo contenido de grasa, 1% (1 taza) / 1.5 g   Elija: Leche descremada (1 taza) / 0.3 g   Evite: Queso duro (1 oz/28 g) / 6 g   Elija: Queso descremado (1 oz/28 g) / 2-3 g   Evite: Queso cottage, 4% grasa (1 taza)/ 6.5 g   Elija: Queso cottage con bajo contenido de grasa, 1% grasa (1 taza)/ 1.5 g   Evite: Helado (1 taza) / 9 g   Elija: Sorbete (1 taza) / 2.5 g   Elija: Yogurt helado sin contenido de  grasa (1 taza) / 0.3 g   Elija: Barras de fruta congeladas / vestigios   Evite: Crema batida (1 cucharada) / 3.5 g   Elija: Batidos glac sin lcteos (1 cucharada) / 1 g  Condimentos / Grasas saturadas (g)  Evite: Mayonesa (1 cucharada) / 2 g   Elija: Mayonesa con bajo contenido de grasa (1 cucharada) / 1 g   Evite: Manteca (1 cucharada) / 7 g   Elija: Margarina extra light (1 cucharada) / 1 g   Evite: Aceite de coco (1 cucharada) / 11.8 g   Elija: Aceite de oliva (1 cucharada) / 1.8 g   Elija: Aceite de maz (1 cucharada) / 1.7 g   Elija: Aceite de crtamo (1 cucharada) / 1.2 g   Elija: Aceite de girasol (1 cucharada) / 1.4 g   Elija: Aceite de soja (1 cucharada) / 2.4 g   Elija: Aceite de canola (1 cucharada) / 1 g  Document Released: 06/19/2005 Document Revised: 03/01/2011 [employer] Patient Information 2012 [employer]. Ejercicios para perder peso (Exercise to Lose Weight) La actividad fsica y una dieta saludable ayudan a perder peso. El mdico podr sugerirle ejercicios especficos. IDEAS Y CONSEJOS PARA HACER EJERCICIOS  Elija opciones econmicas que disfrute hacer , como caminar, andar en bicicleta o los vdeos para ejercitarse.   Utilice las escaleras en lugar del ascensor.   Camine durante la hora del almuerzo.   Estacione el auto lejos del lugar de trabajo o estudio.   Concurra a un gimnasio o tome clases de gimnasia.   Comience con 5  10 minutos de actividad fsica por da. Ejercite hasta 30 minutos, 4 a 6 das por semana.   Utilice zapatos que tengan un buen soporte y ropas cmodas.   Elongue antes y despus de ejercitar.   Ejercite hasta que aumente la respiracin y el corazn palpite rpido.   Beba agua extra cuando ejercite.   No haga ejercicio hasta lastimarse, sentirse mareado o que le falte mucho el aire.  La actividad fsica puede quemar alrededor de 150 caloras.  Correr 20 cuadras en 15 minutos.   Jugar vley durante 45 a 60  minutos.   Limpiar y encerar el auto durante 45 a 60 minutos.   Jugar ftbol americano de toque.   Caminar 25 cuadras en 35 minutos.   Empujar un cochecito 20 cuadras en 30 minutos.   Jugar baloncesto durante 30 minutos.   Rastrillar hojas secas durante 30 minutos.   Andar en bicicleta 80 cuadras en 30 minutos.     Caminar 30 cuadras en 30 minutos.   Bailar durante 30 minutos.   Quitar la nieve con una pala durante 15 minutos.   Nadar vigorosamente durante 20 minutos.   Subir escaleras durante 15 minutos.   Andar en bicicleta 60 cuadras durante 15 minutos.   Arreglar el jardn entre 30 y 45 minutos.   Saltar a la soga durante 15 minutos.   Limpiar vidrios o pisos durante 45 a 60 minutos.  Document Released: 09/23/2010 Document Revised: 03/01/2011 Exit[employer] Patient Information 2012 [employer]. 

## 2012-06-17 ENCOUNTER — Ambulatory Visit
Admission: RE | Admit: 2012-06-17 | Discharge: 2012-06-17 | Disposition: A | Discharge status: Home / Self Care | Payer: Medicare Other | Source: Ambulatory Visit | Attending: Family Medicine | Admitting: Family Medicine

## 2012-06-17 DIAGNOSIS — Z1231 Encounter for screening mammogram for malignant neoplasm of breast: Secondary | ICD-10-CM

## 2012-07-04 ENCOUNTER — Ambulatory Visit (INDEPENDENT_AMBULATORY_CARE_PROVIDER_SITE_OTHER): Payer: Medicare Other

## 2012-07-04 DIAGNOSIS — Z13820 Encounter for screening for osteoporosis: Secondary | ICD-10-CM

## 2012-07-04 DIAGNOSIS — Z780 Asymptomatic menopausal state: Secondary | ICD-10-CM

## 2012-07-08 ENCOUNTER — Other Ambulatory Visit: Payer: Self-pay | Admitting: *Deleted

## 2012-07-08 DIAGNOSIS — M810 Age-related osteoporosis without current pathological fracture: Secondary | ICD-10-CM

## 2013-05-15 ENCOUNTER — Encounter: Payer: Self-pay | Admitting: Gynecology

## 2013-05-15 ENCOUNTER — Other Ambulatory Visit: Payer: Self-pay | Admitting: Gynecology

## 2013-05-15 ENCOUNTER — Ambulatory Visit (INDEPENDENT_AMBULATORY_CARE_PROVIDER_SITE_OTHER): Payer: Medicare Other | Admitting: Gynecology

## 2013-05-15 VITALS — BP 138/88

## 2013-05-15 DIAGNOSIS — N9089 Other specified noninflammatory disorders of vulva and perineum: Secondary | ICD-10-CM

## 2013-05-15 DIAGNOSIS — N907 Vulvar cyst: Secondary | ICD-10-CM | POA: Insufficient documentation

## 2013-05-15 NOTE — Patient Instructions (Signed)
Quiste epidérmico   (Epidermal Cyst)  Un quiste epidérmico se denomina también quiste sebáceo, quiste de inclusión epidérmica o quiste infundibular. Estos quistes contienen una sustancia "pastosa" o similar al "queso" y puede tener mal olor. Esta sustancia es una proteína denominada keratina. Estos quistes generalmente se forman en el rostro, el cuello o el tronco. También pueden aparecer en la zona vaginal u otras partes de los genitales, tanto en hombres como en mujeres. En general son pequeños bultos indoloros, que crecen lentamente y que se mueven libremente debajo de la piel. Es importante no tratar de apretarlos para extraer la sustancia que contienen. Esto puede ocasionar una infección que origine dolor e hinchazón en el área.   CAUSAS   La causa del puede ser una lesión penetrante profunda o un folículo piloso obstruido, generalmente asociado al acné.   SÍNTOMAS   Los quistes epidermicos pueden inflamarse y causar:   · Enrojecimiento.  · Sensibilidad.  · Aumento de la temperatura en la zona.  · Material que drena de color blanco grisáceo, consistente y de olor desagradable.  DIAGNÓSTICO  Generalmente estas infecciones son diagnosticadas por el profesional durante el examen físico. En raras ocasiones será necesario realizar una biopsia para descartar otros trastornos que parezcan ser similares.   TRATAMIENTO  · Generalmente mejoran y desaparecen sin tratamiento. No suelen ser peligrosos.  · Pueden inflamarse y sensibilizarse si se infectan. Esto puede requerir la apertura y drenaje del quiste. Podrá ser necesaria la administración de antibióticos. Cuando la infección haya desaparecido, el quiste podrá eliminarse con una cirugía menor.  · Los pequeños quistes inflamados generalmente pueden tratarse inyectado corticoides con los antibióticos.  · En algunos casos el quiste se agranda y puede ser una preocupación. Si esto ocurre, es necesario extirparlo quirúrgicamente en el consultorio del  profesional.  INSTRUCCIONES PARA EL CUIDADO EN EL HOGAR   · Tome sólo medicamentos de venta libre o recetados, según las indicaciones del médico.  · Tome los antibióticos como se le indicó. Tómelos todos, aunque se sienta mejor.  SOLICITE ATENCIÓN MÉDICA SI:   · Siente dolor, observa enrojecimiento o hinchazón.  · El problema no mejora, o empeora.  · Tiene preguntas o preocupaciones.  ASEGÚRESE DE QUE:   · Comprende estas instrucciones.  · Controlará su enfermedad.  · Solicitará ayuda de inmediato si no mejora o si empeora.  Document Released: 07/31/2006 Document Revised: 09/11/2011  ExitCare® Patient Information ©2014 [employer].

## 2013-05-15 NOTE — Progress Notes (Signed)
Patient is a 72 year old who presented to the office today concerned about this "nodule" that she noted in her vaginal area. She denies any vaginal discharge. She was seen for gynecological examination in 2013. Dr. Phillips Odor is her PCP who has been doing her lab work. Patient is on no hormone replacement therapy.  Exam: Bartholin urethra Skene glands with atrophic changes 1-1/2 cm rounded mobile nodule was noted between the superior portion of the right labia majora and minora nontender nonerythematous Vaginal exam: No lesions or discharge Cervix: No lesions or discharge  Assessment/plan: Patient scheduled for annual exam first week in December. We're going to plan on excision and removal of this vaginal nodule and ambulatory surgical Center. Patient will be counseled when she returns back to the office. This appears to be a large epidermal inclusion cyst which is causing her discomfort.

## 2013-06-04 ENCOUNTER — Encounter: Payer: Self-pay | Admitting: Gynecology

## 2013-06-04 ENCOUNTER — Ambulatory Visit (INDEPENDENT_AMBULATORY_CARE_PROVIDER_SITE_OTHER): Payer: Medicare Other | Admitting: Gynecology

## 2013-06-04 ENCOUNTER — Encounter (HOSPITAL_BASED_OUTPATIENT_CLINIC_OR_DEPARTMENT_OTHER): Payer: Self-pay | Admitting: *Deleted

## 2013-06-04 VITALS — BP 130/86 | Ht 58.5 in | Wt 202.0 lb

## 2013-06-04 DIAGNOSIS — N898 Other specified noninflammatory disorders of vagina: Secondary | ICD-10-CM

## 2013-06-04 DIAGNOSIS — Z01818 Encounter for other preprocedural examination: Secondary | ICD-10-CM

## 2013-06-04 NOTE — Progress Notes (Signed)
Kaitlin Ayala is an 72 y.o. female. Who presented to the office today for her preoperative examination. Patient was seen in the office on November 13 complaining of a "nodule" that she noted in her vaginal area. She denies any vaginal discharge. She was seen for gynecological examination in 2013. Dr. Phillips Odor is her PCP who has been doing her lab work. Patient is on no hormone replacement therapy.  During the exam she was noted to have a 1-1/2 cm rounded mobile nodule between superior portion of the right labia majora and minora which was nontender and nonerythematous. Patient scheduled for removal of vaginal inclusion cysts for biopsy as well at the end of the week.  The patient had her colonoscopy this year and reports that 6 benign polyps were removed and the she's on a five-year recall cycle for colonoscopy. Her last bone density study was normal in 2013. Patient with no prior history of abnormal Pap smear. Last Pap smear was normal in 2004. Patient declined flu vaccine as well as the shingles vaccine.   Pertinent Gynecological History: Menses: post-menopausal Bleeding: none Contraception: post menopausal status DES exposure: denies Blood transfusions: none Sexually transmitted diseases: no past history Previous GYN Procedures: 2 normal spontaneous vaginal deliveries  Last mammogram: normal Date: 2013 Last pap: normal Date: 2012 OB History: G3, P2,A1   Menstrual History: Menarche age: 33  Patient's last menstrual period was 05/18/1991.    Past Medical History  Diagnosis Date  . Hypertension   . Left knee pain   . AC (acromioclavicular) joint bone spurs     KNEES    Past Surgical History  Procedure Laterality Date  . Appendectomy    . Cholecystectomy    . Cataract extraction, bilateral    . Colonoscopy w/ polypectomy  2005    BENIGN POLYP?  Marland Kitchen Colonoscopy  04/22/2007    Moderate internal hemorrhoids.  Otherwise, normal   . Colonoscopy  04/05/2012    Procedure: COLONOSCOPY;   Surgeon: West Bali, MD;  Location: [facility];  Service: Endoscopy;  Laterality: N/A;  8:30    Family History  Problem Relation Age of Onset  . Cancer Mother     UTERINE  . Heart disease Father   . Diabetes Daughter   . Hypertension Sister   . Colon cancer Neg Hx     Social History:  reports that she quit smoking about 4 years ago. She has never used smokeless tobacco. She reports that she drinks alcohol. She reports that she does not use illicit drugs.  Allergies:  Allergies  Allergen Reactions  . Codeine Hives  . Iodine Other (See Comments)    Does not know      (Not in a hospital admission)  REVIEW OF SYSTEMS: A ROS was performed and pertinent positives and negatives are included in the history.  GENERAL: No fevers or chills. HEENT: No change in vision, no earache, sore throat or sinus congestion. NECK: No pain or stiffness. CARDIOVASCULAR: No chest pain or pressure. No palpitations. PULMONARY: No shortness of breath, cough or wheeze. GASTROINTESTINAL: No abdominal pain, nausea, vomiting or diarrhea, melena or bright red blood per rectum. GENITOURINARY: No urinary frequency, urgency, hesitancy or dysuria. MUSCULOSKELETAL: No joint or muscle pain, no back pain, no recent trauma. DERMATOLOGIC: No rash, no itching, no lesions. ENDOCRINE: No polyuria, polydipsia, no heat or cold intolerance. No recent change in weight. HEMATOLOGICAL: No anemia or easy bruising or bleeding. NEUROLOGIC: No headache, seizures, numbness, tingling or weakness. PSYCHIATRIC: No depression,  no loss of interest in normal activity or change in sleep pattern.     Blood pressure 130/86, height 4' 10.5" (1.486 m), weight 202 lb (91.627 kg), last menstrual period 05/18/1991.  Physical Exam:  HEENT:unremarkable Neck:Supple, midline, no thyroid megaly, no carotid bruits Lungs:  Clear to auscultation no rhonchi's or wheezes Heart:Regular rate and rhythm, no murmurs or gallops Breast Exam:no palpable  masses or tenderness no supraclavicular axillary lymphadenopathy Abdomen:soft nontender no rebound guarding Pelvic:BUS  Physical Exam  Genitourinary:        Vagina:within normal limits atrophic changes Cervix:no lesions or discharge Uterus:anteverted normal size shape and consistency Adnexa:no palpable mass or tenderness Extremities: No cords, no edema Rectal:unremarkable. Colonoscopy this year 6 benign polyps removed   Assessment/Plan: Patient scheduled for excision of vaginal inclusion cyst. We will do her case under intravenous sedation and local anesthesia. The risks benefits and pros and cons of the operation were discussed in Spanish with the patient to include the following:                        Patient was counseled as to the risk of surgery to include the following:  1. Infection (prohylactic antibiotics will be administered)  2. DVT/Pulmonary Embolism (prophylactic pneumo compression stockings will be used)  3.Trauma to internal organs requiring additional surgical procedure to repair any injury to     Internal organs requiring perhaps additional hospitalization days.  4.Hemmorhage requiring transfusion and blood products which carry risks such as anaphylactic reaction, hepatitis and AIDS  Patient had received literature information on the procedure scheduled and all her questions were answered and fully accepts all risk.   [facility] HMD10:18 AMTD@Note : This dictation was prepared with  Dragon/digital dictation along withSmart phrase technology. Any transcriptional errors that result from this process are unintentional.      Ok Edwards 06/04/2013, 10:10 AM  Note: This dictation was prepared with  Dragon/digital dictation along withSmart phrase technology. Any transcriptional errors that result from this process are unintentional.

## 2013-06-04 NOTE — Progress Notes (Signed)
PT SPEAKS AND UNDERSTANDS SOME ENGLISH,  SPANISH IS PREFERRED LANGUAGE.  OBTAINED INFO AND INSTRUCTIONS GIVEN TO HUSBAND WHO INTERPRETED .  NPO AFTER MN. NEEDS CBC, BMET, AND UA. WILL TAKE NADOLOL AM DOS W/ SIP OF WATER.  THEY REQUESTED INTERPRETOR.  ARRANGED SPANISH INTERPRETOR THRU CARE MANAGEMENT TO ARRIVE AT 0600.

## 2013-06-05 NOTE — Anesthesia Preprocedure Evaluation (Addendum)
Anesthesia Evaluation  Patient identified by MRN, date of birth, ID band Patient awake    Reviewed: Allergy & Precautions, H&P , NPO status , Patient's Chart, lab work & pertinent test results  Airway Mallampati: III TM Distance: >3 FB Neck ROM: Full    Dental  (+) Teeth Intact, Chipped and Caps,    Pulmonary neg pulmonary ROS, former smoker,  breath sounds clear to auscultation  Pulmonary exam normal       Cardiovascular hypertension, Pt. on medications and Pt. on home beta blockers Rhythm:Regular Rate:Normal     Neuro/Psych negative neurological ROS  negative psych ROS   GI/Hepatic negative GI ROS, Neg liver ROS,   Endo/Other  negative endocrine ROS  Renal/GU negative Renal ROS  negative genitourinary   Musculoskeletal negative musculoskeletal ROS (+)   Abdominal   Peds negative pediatric ROS (+)  Hematology negative hematology ROS (+)   Anesthesia Other Findings   Reproductive/Obstetrics negative OB ROS                        Anesthesia Physical Anesthesia Plan  ASA: II  Anesthesia Plan: MAC   Post-op Pain Management:    Induction: Intravenous  Airway Management Planned: LMA  Additional Equipment:   Intra-op Plan:   Post-operative Plan: Extubation in OR  Informed Consent: I have reviewed the patients History and Physical, chart, labs and discussed the procedure including the risks, benefits and alternatives for the proposed anesthesia with the patient or authorized representative who has indicated his/her understanding and acceptance.   Dental advisory given  Plan Discussed with: CRNA  Anesthesia Plan Comments:        Anesthesia Quick Evaluation

## 2013-06-06 ENCOUNTER — Encounter (HOSPITAL_BASED_OUTPATIENT_CLINIC_OR_DEPARTMENT_OTHER): Payer: Medicare Other | Admitting: Anesthesiology

## 2013-06-06 ENCOUNTER — Encounter (HOSPITAL_BASED_OUTPATIENT_CLINIC_OR_DEPARTMENT_OTHER)
Admission: RE | Disposition: A | Discharge status: Home / Self Care | Payer: Self-pay | Source: Ambulatory Visit | Attending: Gynecology

## 2013-06-06 ENCOUNTER — Ambulatory Visit (HOSPITAL_BASED_OUTPATIENT_CLINIC_OR_DEPARTMENT_OTHER)
Admission: RE | Admit: 2013-06-06 | Discharge: 2013-06-06 | Disposition: A | Discharge status: Home / Self Care | Payer: Medicare Other | Source: Ambulatory Visit | Attending: Gynecology | Admitting: Gynecology

## 2013-06-06 ENCOUNTER — Encounter (HOSPITAL_BASED_OUTPATIENT_CLINIC_OR_DEPARTMENT_OTHER): Payer: Self-pay | Admitting: *Deleted

## 2013-06-06 ENCOUNTER — Ambulatory Visit (HOSPITAL_BASED_OUTPATIENT_CLINIC_OR_DEPARTMENT_OTHER): Payer: Medicare Other | Admitting: Anesthesiology

## 2013-06-06 DIAGNOSIS — N9089 Other specified noninflammatory disorders of vulva and perineum: Secondary | ICD-10-CM | POA: Insufficient documentation

## 2013-06-06 DIAGNOSIS — Z87891 Personal history of nicotine dependence: Secondary | ICD-10-CM | POA: Insufficient documentation

## 2013-06-06 DIAGNOSIS — I82890 Acute embolism and thrombosis of other specified veins: Secondary | ICD-10-CM | POA: Insufficient documentation

## 2013-06-06 DIAGNOSIS — I10 Essential (primary) hypertension: Secondary | ICD-10-CM | POA: Insufficient documentation

## 2013-06-06 DIAGNOSIS — I863 Vulval varices: Secondary | ICD-10-CM | POA: Insufficient documentation

## 2013-06-06 HISTORY — PX: VULVA /PERINEUM BIOPSY: SHX319

## 2013-06-06 HISTORY — DX: Personal history of colonic polyps: Z86.010

## 2013-06-06 LAB — BASIC METABOLIC PANEL
Calcium: 9.5 mg/dL (ref 8.4–10.5)
Creatinine, Ser: 0.71 mg/dL (ref 0.50–1.10)
GFR calc Af Amer: >90 mL/min (ref >90)
GFR calc non Af Amer: 84 mL/min — ABNORMAL LOW (ref >90)
Potassium: 3.6 mEq/L (ref 3.5–5.1)
Sodium: 138 mEq/L (ref 135–145)

## 2013-06-06 LAB — CBC
Hemoglobin: 12.5 g/dL (ref 12.0–15.0)
MCH: 31.3 pg (ref 26.0–34.0)
MCHC: 34.3 g/dL (ref 30.0–36.0)
Platelets: 190 10*3/uL (ref 150–400)
RBC: 3.99 MIL/uL (ref 3.87–5.11)

## 2013-06-06 LAB — URINE MICROSCOPIC-ADD ON

## 2013-06-06 LAB — URINALYSIS, ROUTINE W REFLEX MICROSCOPIC
Bilirubin Urine: NEGATIVE
Ketones, ur: NEGATIVE mg/dL
Nitrite: NEGATIVE
Protein, ur: NEGATIVE mg/dL
pH: 5.5 (ref 5.0–8.0)

## 2013-06-06 SURGERY — BIOPSY, VULVA
Anesthesia: Monitor Anesthesia Care | Site: Vulva

## 2013-06-06 MED ORDER — TRAMADOL HCL 50 MG PO TABS: 50.0000 mg | ORAL_TABLET | Freq: Four times a day (QID) | ORAL | Status: AC | PRN

## 2013-06-06 MED ORDER — MIDAZOLAM HCL 2 MG/2ML IJ SOLN
INTRAMUSCULAR | Status: AC
  Filled 2013-06-06: qty 2

## 2013-06-06 MED ORDER — METOCLOPRAMIDE HCL 10 MG PO TABS: 10.0000 mg | ORAL_TABLET | Freq: Three times a day (TID) | ORAL | Status: AC

## 2013-06-06 MED ORDER — FENTANYL CITRATE 0.05 MG/ML IJ SOLN
INTRAMUSCULAR | Status: DC | PRN
  Administered 2013-06-06 (×4): 25 ug via INTRAVENOUS

## 2013-06-06 MED ORDER — FENTANYL CITRATE 0.05 MG/ML IJ SOLN
INTRAMUSCULAR | Status: AC
  Filled 2013-06-06: qty 2

## 2013-06-06 MED ORDER — ONDANSETRON HCL 4 MG/2ML IJ SOLN
INTRAMUSCULAR | Status: DC | PRN
  Administered 2013-06-06: 4 mg via INTRAVENOUS

## 2013-06-06 MED ORDER — CEFOTETAN DISODIUM-DEXTROSE 2-2.08 GM-% IV SOLR
INTRAVENOUS | Status: AC
  Filled 2013-06-06: qty 50

## 2013-06-06 MED ORDER — PROPOFOL 10 MG/ML IV EMUL
INTRAVENOUS | Status: DC | PRN
  Administered 2013-06-06: 120 ug/kg/min via INTRAVENOUS

## 2013-06-06 MED ORDER — DEXTROSE 5 % IV SOLN
2.0000 g | INTRAVENOUS | Status: AC
  Administered 2013-06-06 (×2): 2 g via INTRAVENOUS
  Filled 2013-06-06: qty 2

## 2013-06-06 MED ORDER — MIDAZOLAM HCL 5 MG/5ML IJ SOLN
INTRAMUSCULAR | Status: DC | PRN
  Administered 2013-06-06 (×2): 1 mg via INTRAVENOUS

## 2013-06-06 MED ORDER — PROMETHAZINE HCL 25 MG/ML IJ SOLN
6.2500 mg | INTRAMUSCULAR | Status: DC | PRN
  Filled 2013-06-06: qty 1

## 2013-06-06 MED ORDER — LIDOCAINE-EPINEPHRINE 1 %-1:100000 IJ SOLN
INTRAMUSCULAR | Status: DC | PRN
  Administered 2013-06-06: 2 mL

## 2013-06-06 MED ORDER — LACTATED RINGERS IV SOLN
INTRAVENOUS | Status: DC
  Administered 2013-06-06 (×2): via INTRAVENOUS
  Filled 2013-06-06: qty 1000

## 2013-06-06 MED ORDER — BACITRACIN-NEOMYCIN-POLYMYXIN OINTMENT TUBE
TOPICAL_OINTMENT | CUTANEOUS | Status: DC | PRN
  Administered 2013-06-06: 1 via TOPICAL

## 2013-06-06 MED ORDER — LIDOCAINE HCL (CARDIAC) 20 MG/ML IV SOLN
INTRAVENOUS | Status: DC | PRN
  Administered 2013-06-06: 60 mg via INTRAVENOUS

## 2013-06-06 MED ORDER — LACTATED RINGERS IV SOLN
INTRAVENOUS | Status: DC
  Filled 2013-06-06: qty 1000

## 2013-06-06 MED ORDER — FENTANYL CITRATE 0.05 MG/ML IJ SOLN
25.0000 ug | INTRAMUSCULAR | Status: DC | PRN
  Filled 2013-06-06: qty 1

## 2013-06-06 SURGICAL SUPPLY — 38 items
BLADE SURG 15 STRL LF DISP TIS (BLADE) ×1 IMPLANT
BLADE SURG 15 STRL SS (BLADE) ×1
CANISTER SUCTION 1200CC (MISCELLANEOUS) ×2 IMPLANT
CANISTER SUCTION 2500CC (MISCELLANEOUS) IMPLANT
CATH ROBINSON RED A/P 14FR (CATHETERS) IMPLANT
CLOTH BEACON ORANGE TIMEOUT ST (SAFETY) ×2 IMPLANT
COVER TABLE BACK 60X90 (DRAPES) ×2 IMPLANT
DRAPE LG THREE QUARTER DISP (DRAPES) ×2 IMPLANT
DRAPE UNDERBUTTOCKS STRL (DRAPE) ×2 IMPLANT
ELECT NEEDLE TIP 2.8 STRL (NEEDLE) ×2 IMPLANT
ELECT REM PT RETURN 9FT ADLT (ELECTROSURGICAL) ×2
ELECTRODE REM PT RTRN 9FT ADLT (ELECTROSURGICAL) ×1 IMPLANT
GAUZE SPONGE 4X4 16PLY XRAY LF (GAUZE/BANDAGES/DRESSINGS) ×2 IMPLANT
GLOVE BIO SURGEON STRL SZ 6.5 (GLOVE) ×4 IMPLANT
GLOVE BIO SURGEON STRL SZ7 (GLOVE) ×2 IMPLANT
GLOVE BIO SURGEON STRL SZ7.5 (GLOVE) ×2 IMPLANT
GOWN PREVENTION PLUS LG XLONG (DISPOSABLE) ×2 IMPLANT
GOWN STRL REIN XL XLG (GOWN DISPOSABLE) ×4 IMPLANT
LEGGING LITHOTOMY PAIR STRL (DRAPES) ×2 IMPLANT
NS IRRIG 500ML POUR BTL (IV SOLUTION) ×2 IMPLANT
PACK BASIN DAY SURGERY FS (CUSTOM PROCEDURE TRAY) ×2 IMPLANT
PAD OB MATERNITY 4.3X12.25 (PERSONAL CARE ITEMS) ×2 IMPLANT
PAD PREP 24X48 CUFFED NSTRL (MISCELLANEOUS) ×2 IMPLANT
PENCIL BUTTON HOLSTER BLD 10FT (ELECTRODE) ×2 IMPLANT
SCOPETTES 8  STERILE (MISCELLANEOUS)
SCOPETTES 8 STERILE (MISCELLANEOUS) IMPLANT
SUT VIC AB 2-0 PS2 27 (SUTURE) ×10 IMPLANT
SUT VIC AB 3-0 CT1 27 (SUTURE)
SUT VIC AB 3-0 CT1 27XBRD (SUTURE) IMPLANT
SUT VIC AB 3-0 FS2 27 (SUTURE) ×4 IMPLANT
SUT VIC AB 3-0 X1 27 (SUTURE) IMPLANT
TOWEL OR 17X24 6PK STRL BLUE (TOWEL DISPOSABLE) ×4 IMPLANT
TRAY DSU PREP LF (CUSTOM PROCEDURE TRAY) ×2 IMPLANT
TUBE ANAEROBIC SPECIMEN COL (MISCELLANEOUS) IMPLANT
TUBE CONNECTING 12X1/4 (SUCTIONS) ×2 IMPLANT
VACUUM HOSE 7/8X10 W/ WAND (MISCELLANEOUS) IMPLANT
WATER STERILE IRR 500ML POUR (IV SOLUTION) IMPLANT
YANKAUER SUCT BULB TIP NO VENT (SUCTIONS) ×2 IMPLANT

## 2013-06-06 NOTE — Interval H&P Note (Signed)
History and Physical Interval Note:  06/06/2013 7:13 AM  Kaitlin Ayala  has presented today for surgery, with the diagnosis of vulvar inclusion cysts  The various methods of treatment have been discussed with the patient and family. After consideration of risks, benefits and other options for treatment, the patient has consented to  Procedure(s) with comments: Excision of vulvar inclusion cysts (N/A) - Excision of vulvar inclusion cysts as a surgical intervention .  The patient's history has been reviewed, patient examined, no change in status, stable for surgery.  I have reviewed the patient's chart and labs.  Questions were answered to the patient's satisfaction.     Ok Edwards

## 2013-06-06 NOTE — H&P (View-Only) (Signed)
Kaitlin Ayala is an 72 y.o. female. Who presented to the office today for her preoperative examination. Patient was seen in the office on November 13 complaining of a "nodule" that she noted in her vaginal area. She denies any vaginal discharge. She was seen for gynecological examination in 2013. Dr. Golding is her PCP who has been doing her lab work. Patient is on no hormone replacement therapy.  During the exam she was noted to have a 1-1/2 cm rounded mobile nodule between superior portion of the right labia majora and minora which was nontender and nonerythematous. Patient scheduled for removal of vaginal inclusion cysts for biopsy as well at the end of the week.  The patient had her colonoscopy this year and reports that 6 benign polyps were removed and the she's on a five-year recall cycle for colonoscopy. Her last bone density study was normal in 2013. Patient with no prior history of abnormal Pap smear. Last Pap smear was normal in 2004. Patient declined flu vaccine as well as the shingles vaccine.   Pertinent Gynecological History: Menses: post-menopausal Bleeding: none Contraception: post menopausal status DES exposure: denies Blood transfusions: none Sexually transmitted diseases: no past history Previous GYN Procedures: 2 normal spontaneous vaginal deliveries  Last mammogram: normal Date: 2013 Last pap: normal Date: 2012 OB History: G3, P2,A1   Menstrual History: Menarche age: 11  Patient's last menstrual period was 05/18/1991.    Past Medical History  Diagnosis Date  . Hypertension   . Left knee pain   . AC (acromioclavicular) joint bone spurs     KNEES    Past Surgical History  Procedure Laterality Date  . Appendectomy    . Cholecystectomy    . Cataract extraction, bilateral    . Colonoscopy w/ polypectomy  2005    BENIGN POLYP?  . Colonoscopy  04/22/2007    Moderate internal hemorrhoids.  Otherwise, normal   . Colonoscopy  04/05/2012    Procedure: COLONOSCOPY;   Surgeon: Sandi L Fields, MD;  Location: [facility];  Service: Endoscopy;  Laterality: N/A;  8:30    Family History  Problem Relation Age of Onset  . Cancer Mother     UTERINE  . Heart disease Father   . Diabetes Daughter   . Hypertension Sister   . Colon cancer Neg Hx     Social History:  reports that she quit smoking about 4 years ago. She has never used smokeless tobacco. She reports that she drinks alcohol. She reports that she does not use illicit drugs.  Allergies:  Allergies  Allergen Reactions  . Codeine Hives  . Iodine Other (See Comments)    Does not know      (Not in a hospital admission)  REVIEW OF SYSTEMS: A ROS was performed and pertinent positives and negatives are included in the history.  GENERAL: No fevers or chills. HEENT: No change in vision, no earache, sore throat or sinus congestion. NECK: No pain or stiffness. CARDIOVASCULAR: No chest pain or pressure. No palpitations. PULMONARY: No shortness of breath, cough or wheeze. GASTROINTESTINAL: No abdominal pain, nausea, vomiting or diarrhea, melena or bright red blood per rectum. GENITOURINARY: No urinary frequency, urgency, hesitancy or dysuria. MUSCULOSKELETAL: No joint or muscle pain, no back pain, no recent trauma. DERMATOLOGIC: No rash, no itching, no lesions. ENDOCRINE: No polyuria, polydipsia, no heat or cold intolerance. No recent change in weight. HEMATOLOGICAL: No anemia or easy bruising or bleeding. NEUROLOGIC: No headache, seizures, numbness, tingling or weakness. PSYCHIATRIC: No depression,   no loss of interest in normal activity or change in sleep pattern.     Blood pressure 130/86, height 4' 10.5" (1.486 m), weight 202 lb (91.627 kg), last menstrual period 05/18/1991.  Physical Exam:  HEENT:unremarkable Neck:Supple, midline, no thyroid megaly, no carotid bruits Lungs:  Clear to auscultation no rhonchi's or wheezes Heart:Regular rate and rhythm, no murmurs or gallops Breast Exam:no palpable  masses or tenderness no supraclavicular axillary lymphadenopathy Abdomen:soft nontender no rebound guarding Pelvic:BUS  Physical Exam  Genitourinary:        Vagina:within normal limits atrophic changes Cervix:no lesions or discharge Uterus:anteverted normal size shape and consistency Adnexa:no palpable mass or tenderness Extremities: No cords, no edema Rectal:unremarkable. Colonoscopy this year 6 benign polyps removed   Assessment/Plan: Patient scheduled for excision of vaginal inclusion cyst. We will do her case under intravenous sedation and local anesthesia. The risks benefits and pros and cons of the operation were discussed in Spanish with the patient to include the following:                        Patient was counseled as to the risk of surgery to include the following:  1. Infection (prohylactic antibiotics will be administered)  2. DVT/Pulmonary Embolism (prophylactic pneumo compression stockings will be used)  3.Trauma to internal organs requiring additional surgical procedure to repair any injury to     Internal organs requiring perhaps additional hospitalization days.  4.Hemmorhage requiring transfusion and blood products which carry risks such as anaphylactic reaction, hepatitis and AIDS  Patient had received literature information on the procedure scheduled and all her questions were answered and fully accepts all risk.   Ashlie Mcmenamy HMD10:18 AMTD@Note: This dictation was prepared with  Dragon/digital dictation along withSmart phrase technology. Any transcriptional errors that result from this process are unintentional.      Naliyah Neth H 06/04/2013, 10:10 AM  Note: This dictation was prepared with  Dragon/digital dictation along withSmart phrase technology. Any transcriptional errors that result from this process are unintentional.  

## 2013-06-06 NOTE — Op Note (Signed)
   06/06/2013  Operative note:  9:39 AM  PATIENT:  Kaitlin Ayala  72 y.o. female  PRE-OPERATIVE DIAGNOSIS:  vulvar inclusion cyst (right)  POST-OPERATIVE DIAGNOSIS:  vulvar inclusion cyst (right)  PROCEDURE:  Procedure(s): Excision of vulvar inclusion cyst (right)  SURGEON:  Surgeon(s): Ok Edwards, MD  ANESTHESIA:   IV sedation, and 1% lidocaine with 1 100,000 epinephrine local  FINDINGS:a right vaginal nodule was noted upper aspect of the right labia minora measuring approximately 1-1/2 cm  DESCRIPTION OF OPERATION: in size.the patient was taken to the operating room where she received intravenous sedation. Patient had PAS stockings for DVT prophylaxis. Patient then received a gram of Cefotan IV for prophylaxis as well. The patient's legs were placed in the high lithotomy position. Because of patient's allergy to Betadine Hibiclens was used to prep the external genitalia and vagina. The area was identified after the drapes were in place. 1% lidocaine with 1 100,000 epinephrine was infiltrated subdermally for approximately 2 cc. An incision was made directly over the lesion and with the use of a hemostat the nodule was freed from surrounding connective tissue. The base was clamped with a hemostat and the nodule was passed off the operative field. The pedicle was free tied with 0 Vicryl suture. The rest of the layers were closed with 2-0 Vicryl suture. The vaginal mucosa was then reapproximated with a running locking stitch of 2-0 Vicryl suture. Patient was transferred to recovery room stable vital signs. Neosporin was placed over the incision before she was moved to the recovery room. Patient tolerated procedure well without any complications.  ESTIMATED BLOOD LOSS:minimal  Intake/Output Summary (Last 24 hours) at 06/06/13 1610 Last data filed at 06/06/13 0900  Gross per 24 hour  Intake   1000 ml  Output      0 ml  Net   1000 ml     BLOOD ADMINISTERED:none   LOCAL  MEDICATIONS USED:  LIDOCAINE   SPECIMEN:  Source of Specimen:  One percent lidocaine with 1 100,000 epinephrine for a total of 2 cc subdermally  DISPOSITION OF SPECIMEN:  PATHOLOGY  COUNTS:  YES  PLAN OF CARE: Transfer to PACU  [facility] HMD9:39 AMTD@  Note: This dictation was prepared with  Dragon/digital dictation along withSmart phrase technology. Any transcriptional errors that result from this process are unintentional.

## 2013-06-06 NOTE — Anesthesia Postprocedure Evaluation (Signed)
Anesthesia Post Note  Patient: Kaitlin Ayala  Procedure(s) Performed: Procedure(s) (LRB): Excision of vulvar inclusion cysts (N/A)  Anesthesia type: MAC  Patient location: PACU  Post pain: Pain level controlled  Post assessment: Post-op Vital signs reviewed  Last Vitals:  Filed Vitals:   06/06/13 0930  BP: 113/51  Pulse: 54  Temp:   Resp: 15    Post vital signs: Reviewed  Level of consciousness: sedated  Complications: No apparent anesthesia complications

## 2013-06-06 NOTE — Transfer of Care (Addendum)
Immediate Anesthesia Transfer of Care Note  Patient: Kaitlin Ayala  Procedure(s) Performed: Procedure(s) (LRB): Excision of vulvar inclusion cysts (N/A)  Patient Location: PACU  Anesthesia Type:MAC  Level of Consciousness: awake, alert  and oriented  Airway & Oxygen Therapy: Patient Spontanous Breathing and Patient connected to face mask oxygen  Post-op Assessment: Report given to PACU RN and Post -op Vital signs reviewed and stable  Post vital signs: Reviewed and stable  Complications: No apparent anesthesia complications

## 2013-06-06 NOTE — Anesthesia Procedure Notes (Signed)
Procedure Name: MAC Date/Time: 06/06/2013 7:35 AM Performed by: Norva Pavlov Pre-anesthesia Checklist: Patient identified, Timeout performed, Emergency Drugs available, Suction available and Patient being monitored Patient Re-evaluated:Patient Re-evaluated prior to inductionOxygen Delivery Method: Simple face mask

## 2013-06-07 LAB — URINE CULTURE: Culture: NO GROWTH

## 2013-06-09 ENCOUNTER — Encounter (HOSPITAL_BASED_OUTPATIENT_CLINIC_OR_DEPARTMENT_OTHER): Payer: Self-pay | Admitting: Gynecology

## 2013-06-13 ENCOUNTER — Ambulatory Visit (INDEPENDENT_AMBULATORY_CARE_PROVIDER_SITE_OTHER): Payer: Medicare Other | Admitting: Gynecology

## 2013-06-13 ENCOUNTER — Encounter: Payer: Self-pay | Admitting: Gynecology

## 2013-06-13 VITALS — BP 132/76

## 2013-06-13 DIAGNOSIS — Z09 Encounter for follow-up examination after completed treatment for conditions other than malignant neoplasm: Secondary | ICD-10-CM

## 2013-06-13 NOTE — Progress Notes (Signed)
The patient presented to the office today for a postop visit since she's getting ready to leave for [place] tomorrow. Patient is status post excision of a vulvar inclusion-like cysts which was indurated (right) and causing her discomfort.  The nodule was located at the upper aspect of the right labia minora measuring approximately 1-1/2 cm in size which was excised and submitted for pathological evaluation. Pathology report demonstrated the following:  Diagnosis Cyst, excision - FINDINGS CONSISTENT WITH VARICOSE VEIN, SEE COMMENT. Microscopic Comment Sections show enlarged vein with associated thrombus. There is no evidence of atypia or malignancy  Exam: Area were thrombosed vulvar varicose vein was excised he's healing nicely. Incision clean. Suture material still present.  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 No evidence of malignancy. Patient leaving for [place] tomorrow. The sutures will dissolve. She will continue to do sitz bath daily. The suture will fall off eventually on its own. She Will Pl., Neosporin each bedtime for 2 weeks then 2 times a week thereafter until completely healed. She'll return to see me back in 3 months when she gets back to [place]. Instructions provided in Spanish.

## 2013-07-02 ENCOUNTER — Encounter: Payer: Self-pay | Admitting: Gynecology

## 2013-07-02 ENCOUNTER — Ambulatory Visit (INDEPENDENT_AMBULATORY_CARE_PROVIDER_SITE_OTHER): Payer: Medicare Other | Admitting: Gynecology

## 2013-07-02 VITALS — BP 128/86

## 2013-07-02 DIAGNOSIS — Z4802 Encounter for removal of sutures: Secondary | ICD-10-CM

## 2013-07-02 DIAGNOSIS — Z09 Encounter for follow-up examination after completed treatment for conditions other than malignant neoplasm: Secondary | ICD-10-CM

## 2013-07-02 NOTE — Progress Notes (Signed)
   Patient presented to the office today to have suture removed from the upper aspect of the right labia minora whereby she had an excision of a nodule that was approximately 1-1/2 cm in size which had the following pathology report:  Diagnosis  Cyst, excision  - FINDINGS CONSISTENT WITH VARICOSE VEIN, SEE COMMENT.  Microscopic Comment  Sections show enlarged vein with associated thrombus. There is no evidence of atypia or malignancy  The patient has otherwise done well.  Exam: Area were thrombosed vulvar varicose vein was excised he's healing nicely. Incision clean. Suture material removed. Silver nitrate applied to the area that oozed a little.  Patient otherwise scheduled return back next year for annual exam or when necessary.

## 2014-05-04 ENCOUNTER — Encounter: Payer: Self-pay | Admitting: Gynecology

## 2016-11-15 ENCOUNTER — Encounter: Payer: Self-pay | Admitting: Gynecology

## 2017-03-12 ENCOUNTER — Encounter: Payer: Self-pay | Admitting: Gastroenterology

## 2017-03-13 ENCOUNTER — Encounter: Payer: Self-pay | Admitting: Gastroenterology
